# Patient Record
Sex: Male | Born: 1970 | Hispanic: No | Marital: Married | Smoking: Current some day smoker
Health system: Southern US, Community
[De-identification: ages and names within clinical notes are randomized; demographics above are authoritative.]

---

## 2014-10-15 ENCOUNTER — Other Ambulatory Visit: Payer: Self-pay | Admitting: Infectious Disease

## 2014-10-15 ENCOUNTER — Ambulatory Visit
Admission: RE | Admit: 2014-10-15 | Discharge: 2014-10-15 | Disposition: A | Discharge status: Home / Self Care | Payer: No Typology Code available for payment source | Source: Ambulatory Visit | Attending: Infectious Disease | Admitting: Infectious Disease

## 2014-10-15 DIAGNOSIS — R8271 Bacteriuria: Secondary | ICD-10-CM

## 2015-01-28 ENCOUNTER — Ambulatory Visit (INDEPENDENT_AMBULATORY_CARE_PROVIDER_SITE_OTHER): Payer: Medicaid Other | Admitting: Family Medicine

## 2015-01-28 VITALS — BP 120/74 | HR 72 | Temp 97.5°F | Ht 66.75 in | Wt 160.1 lb

## 2015-01-28 DIAGNOSIS — Z0289 Encounter for other administrative examinations: Secondary | ICD-10-CM | POA: Insufficient documentation

## 2015-01-28 DIAGNOSIS — Z008 Encounter for other general examination: Secondary | ICD-10-CM
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
## 2015-01-28 NOTE — Patient Instructions (Signed)
Nice to meet you. We will see you back in 3 months.

## 2016-06-25 IMAGING — CR DG CHEST 1V
1 series · 1 of 1 positions shown · non-contrast
Comparison: None

CLINICAL DATA: History of TB 15 years ago which was treated,
asymptomatic, question active TB

EXAM:
CHEST  1 VIEW

[w chest pa]
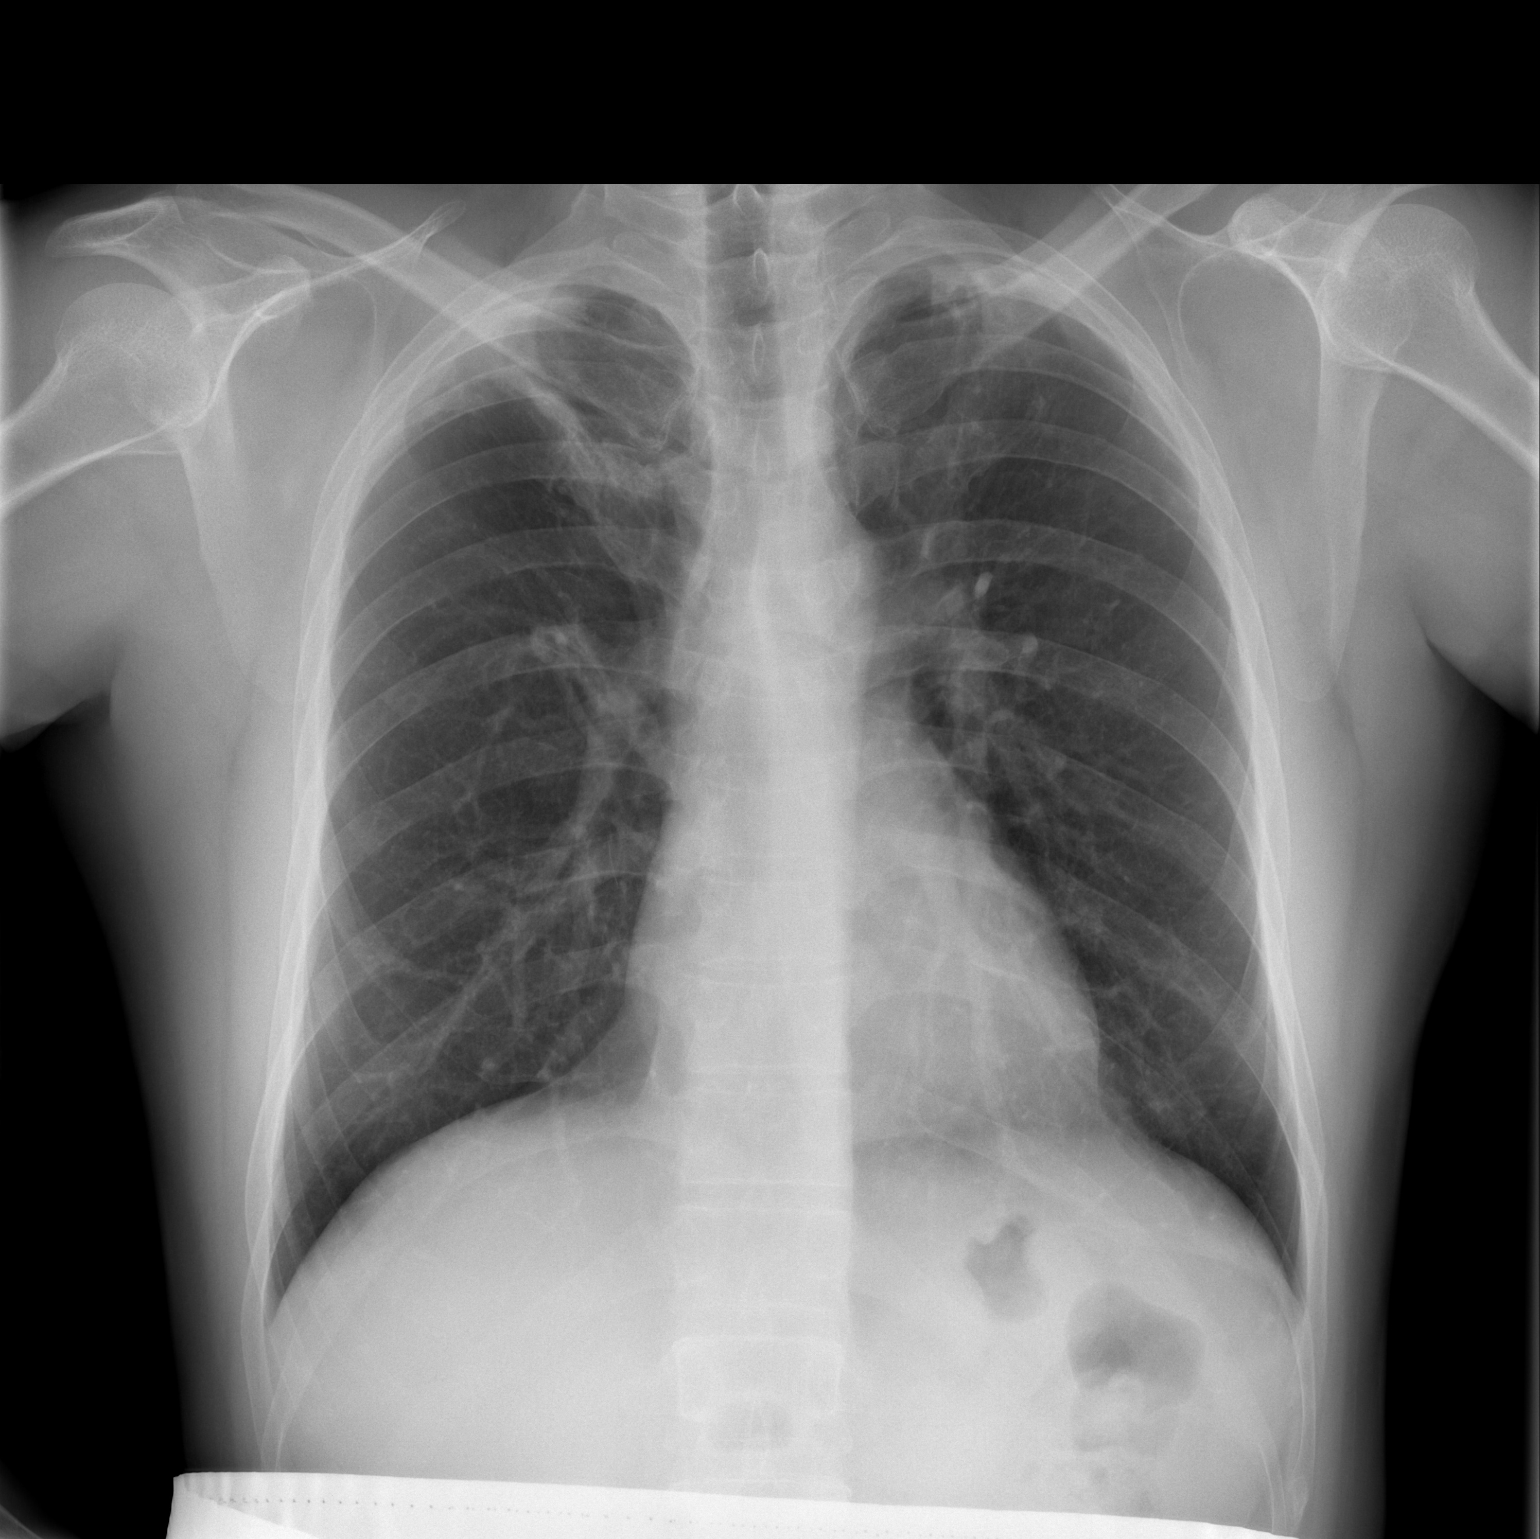

[1 of 1 positions shown; findings below may reference images not displayed]

FINDINGS: Single PA view.

Normal heart size, mediastinal contours and pulmonary vascularity.

Biapical pleural-parenchymal opacities are identified with volume
loss and superior retraction of the hila most likely representing
postinflammatory scarring, asymmetrically greater on RIGHT.

Remaining lungs are clear and definite acute infiltrate is seen.

Osseous structures are unremarkable.
IMPRESSION: Biapical pleural-parenchymal opacities and volume loss most likely
representing postinflammatory scarring, consistent with patient
history of prior treatment for TB.

No definite acute process identified.
